# Patient Record
Sex: Female | Born: 1960 | Race: White | Hispanic: No | Marital: Married | Smoking: Former smoker
Health system: Southern US, Community
[De-identification: ages and names within clinical notes are randomized; demographics above are authoritative.]

## PROBLEM LIST (undated history)

## (undated) DIAGNOSIS — J45909 Unspecified asthma, uncomplicated: Secondary | ICD-10-CM

## (undated) HISTORY — PX: APPENDECTOMY: SHX54

## (undated) HISTORY — PX: CHOLECYSTECTOMY: SHX55

---

## 2005-12-28 ENCOUNTER — Emergency Department: Payer: Self-pay | Admitting: Unknown Physician Specialty

## 2008-01-12 ENCOUNTER — Emergency Department: Payer: Self-pay | Admitting: Emergency Medicine

## 2008-03-10 ENCOUNTER — Emergency Department: Payer: Self-pay | Admitting: Internal Medicine

## 2009-02-13 ENCOUNTER — Emergency Department: Payer: Self-pay | Admitting: Internal Medicine

## 2009-10-14 ENCOUNTER — Emergency Department: Payer: Self-pay | Admitting: Internal Medicine

## 2011-07-28 IMAGING — CR DG CHEST 1V PORT
1 series · 1 of 1 positions shown · non-contrast
Comparison: none

REASON FOR EXAM: sob
COMMENTS:

PROCEDURE:     DXR - DXR PORTABLE CHEST SINGLE VIEW  - February 13, 2009  [DATE]
RESULT:     The lungs are well expanded. There is no focal infiltrate.
Interstitial markings are mildly prominent. The cardiac silhouette is not
inter enlarged. The pulmonary vascularity is not engorged.

[view not recorded]
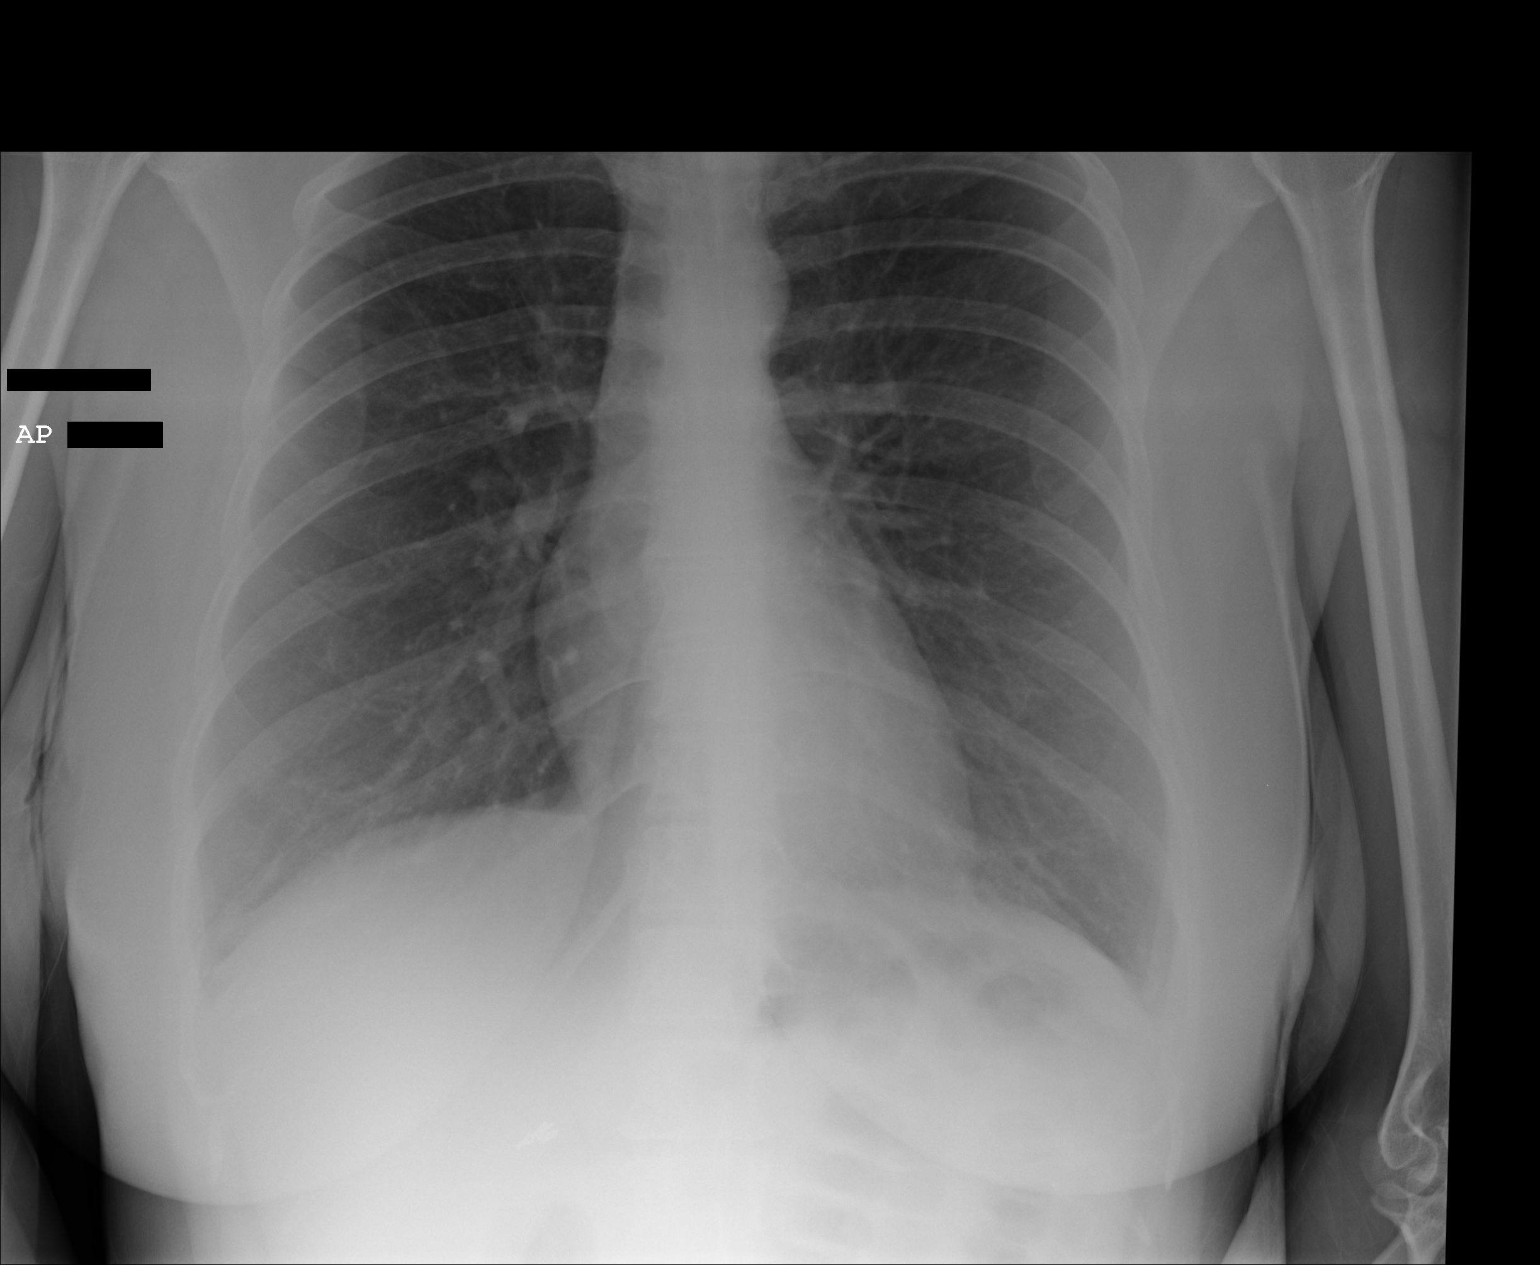

[1 of 1 positions shown; findings below may reference images not displayed]

IMPRESSION: There is mild hyperinflation which may be voluntary or
which could reflect reactive airway disease. I see no definite pneumonia.

## 2017-02-10 ENCOUNTER — Emergency Department
Admission: EM | Admit: 2017-02-10 | Discharge: 2017-02-10 | Disposition: A | Discharge status: Home / Self Care | Payer: Self-pay | Attending: Emergency Medicine | Admitting: Emergency Medicine

## 2017-02-10 ENCOUNTER — Other Ambulatory Visit: Payer: Self-pay

## 2017-02-10 ENCOUNTER — Encounter: Payer: Self-pay | Admitting: Emergency Medicine

## 2017-02-10 DIAGNOSIS — J45909 Unspecified asthma, uncomplicated: Secondary | ICD-10-CM | POA: Insufficient documentation

## 2017-02-10 DIAGNOSIS — R42 Dizziness and giddiness: Secondary | ICD-10-CM | POA: Insufficient documentation

## 2017-02-10 DIAGNOSIS — Z87891 Personal history of nicotine dependence: Secondary | ICD-10-CM | POA: Insufficient documentation

## 2017-02-10 HISTORY — DX: Unspecified asthma, uncomplicated: J45.909

## 2017-02-10 LAB — BASIC METABOLIC PANEL
Anion gap: 9 (ref 5–15)
BUN: 14 mg/dL (ref 6–20)
CHLORIDE: 101 mmol/L (ref 101–111)
CO2: 25 mmol/L (ref 22–32)
CREATININE: 0.91 mg/dL (ref 0.44–1.00)
Calcium: 9.1 mg/dL (ref 8.9–10.3)
GFR calc non Af Amer: >60 mL/min (ref >60)
Glucose, Bld: 93 mg/dL (ref 65–99)
POTASSIUM: 3.5 mmol/L (ref 3.5–5.1)
Sodium: 135 mmol/L (ref 135–145)

## 2017-02-10 LAB — URINALYSIS, COMPLETE (UACMP) WITH MICROSCOPIC
BILIRUBIN URINE: NEGATIVE
Glucose, UA: NEGATIVE mg/dL
Hgb urine dipstick: NEGATIVE
KETONES UR: 5 mg/dL — AB
LEUKOCYTES UA: NEGATIVE
Nitrite: NEGATIVE
PH: 6 (ref 5.0–8.0)
PROTEIN: NEGATIVE mg/dL
Specific Gravity, Urine: 1.014 (ref 1.005–1.030)

## 2017-02-10 LAB — CBC
HEMATOCRIT: 43.6 % (ref 35.0–47.0)
Hemoglobin: 15 g/dL (ref 12.0–16.0)
MCH: 31 pg (ref 26.0–34.0)
MCHC: 34.5 g/dL (ref 32.0–36.0)
MCV: 89.6 fL (ref 80.0–100.0)
PLATELETS: 307 10*3/uL (ref 150–440)
RBC: 4.86 MIL/uL (ref 3.80–5.20)
RDW: 14 % (ref 11.5–14.5)
WBC: 7 10*3/uL (ref 3.6–11.0)

## 2017-02-10 LAB — GLUCOSE, CAPILLARY: GLUCOSE-CAPILLARY: 94 mg/dL (ref 65–99)

## 2017-02-10 MED ORDER — DIAZEPAM 2 MG PO TABS: 2.0000 mg | ORAL_TABLET | Freq: Three times a day (TID) | ORAL | 0 refills | Status: AC | PRN

## 2017-02-10 MED ORDER — DIAZEPAM 2 MG PO TABS
2.0000 mg | ORAL_TABLET | Freq: Once | ORAL | Status: AC
  Administered 2017-02-10: 2 mg via ORAL
  Filled 2017-02-10: qty 1

## 2017-02-10 MED ORDER — MECLIZINE HCL 25 MG PO TABS
25.0000 mg | ORAL_TABLET | Freq: Once | ORAL | Status: AC
  Administered 2017-02-10: 25 mg via ORAL
  Filled 2017-02-10: qty 1

## 2017-02-10 NOTE — Discharge Instructions (Signed)
Please seek medical attention for any high fevers, chest pain, shortness of breath, change in behavior, persistent vomiting, bloody stool or any other new or concerning symptoms.  

## 2017-02-10 NOTE — ED Triage Notes (Signed)
Pt to ED via POV c/o dizziness, pt describes sensation of the room spinning. Pt states that dizziness is worse when she moves her head. Pt also states that she is nauseated. Pt noted to have redness on her upper chest. Pt denies any new medications or foods. Pt denies any changes in her environment. Pt in NAD at this time.

## 2017-02-10 NOTE — ED Provider Notes (Signed)
[facility] Emergency Department Provider Note   ____________________________________________   I have reviewed the triage vital signs and the nursing notes.   HISTORY  Chief Complaint Dizziness   History limited by: Not Limited   HPI Sue Russell is a 56 y.o. female who presents to the emergency department today because of concern for dizziness.  DURATION:2 days TIMING: intermittent SEVERITY: severe QUALITY: sense of movement CONTEXT: patient states that the symptoms started suddenly two days ago. Has never had anything like this before. Denies any recent illness. MODIFYING FACTORS: worse with movement. Goes away with rest. ASSOCIATED SYMPTOMS: denies any headache. Denies any ear pain.  Per medical record review patient has a history of asthma.  Past Medical History:  Diagnosis Date  . Asthma     There are no active problems to display for this patient.   Past Surgical History:  Procedure Laterality Date  . APPENDECTOMY    . CHOLECYSTECTOMY      Prior to Admission medications   Not on File    Allergies Patient has no known allergies.  No family history on file.  Social History Social History   Tobacco Use  . Smoking status: Former Games developermoker  . Smokeless tobacco: Never Used  Substance Use Topics  . Alcohol use: No    Frequency: Never  . Drug use: No    Review of Systems Constitutional: No fever/chills Eyes: No visual changes. ENT: No sore throat. Cardiovascular: Denies chest pain. Respiratory: Denies shortness of breath. Gastrointestinal: No abdominal pain.  No nausea, no vomiting.  No diarrhea.   Genitourinary: Negative for dysuria. Musculoskeletal: Negative for back pain. Skin: Negative for rash. Neurological: Positive for dizziness.  ____________________________________________   PHYSICAL EXAM:  VITAL SIGNS: ED Triage Vitals  Enc Vitals Group     BP 02/10/17 1645 (!) 200/103     Pulse Rate 02/10/17 1642  89     Resp 02/10/17 1642 16     Temp 02/10/17 1642 98.2 F (36.8 C)     Temp Source 02/10/17 1642 Oral     SpO2 02/10/17 1642 95 %   Constitutional: Alert and oriented. Well appearing and in no distress. Eyes: Conjunctivae are normal.  ENT   Head: Normocephalic and atraumatic. TM wnl bilaterally.   Nose: No congestion/rhinnorhea.   Mouth/Throat: Mucous membranes are moist.   Neck: No stridor. Hematological/Lymphatic/Immunilogical: No cervical lymphadenopathy. Cardiovascular: Normal rate, regular rhythm.  No murmurs, rubs, or gallops.  Respiratory: Normal respiratory effort without tachypnea nor retractions. Breath sounds are clear and equal bilaterally. No wheezes/rales/rhonchi. Gastrointestinal: Soft and non tender. No rebound. No guarding.  Genitourinary: Deferred Musculoskeletal: Normal range of motion in all extremities. No lower extremity edema. Neurologic:  Normal speech and language. Face symmetric. Strength 5/5 in upper and lower extremities. Sensation intact. Finger to nose normal. Skin:  Skin is warm, dry and intact. No rash noted. Psychiatric: Mood and affect are normal. Speech and behavior are normal. Patient exhibits appropriate insight and judgment.  ____________________________________________    LABS (pertinent positives/negatives)  BMP wnl CBC wnl Glu 94 UA not consistent with infection ____________________________________________   EKG  I, Phineas SemenGraydon Johnelle Tafolla, attending physician, personally viewed and interpreted this EKG  EKG Time: 1639 Rate: 91 Rhythm: normal sinus rhythm Axis: normal Intervals: qtc 440 QRS: narrow ST changes: no st elevation Impression: normal ekg   ____________________________________________    RADIOLOGY  None  ____________________________________________   PROCEDURES  Procedures  ____________________________________________   INITIAL IMPRESSION / ASSESSMENT AND PLAN / ED  COURSE  Pertinent labs &  imaging results that were available during my care of the patient were reviewed by me and considered in my medical decision making (see chart for details).  Patient presented to the emergency department today because of dizziness.  Differential would include vertigo, central lesion, anemia, electrolyte abnormality, infection.  Clinical history is very consistent with peripheral cause of vertigo.  It does go away when the patient is still.  It does get worse with movement of the head.  Patient stated that she did get some benefit from Valium.  Blood work and urine without concerning findings.  I do think at this point BPPV likely.  Discussed this with the patient.  I did discuss return precautions.  Will give patient ENT follow-up information.   ____________________________________________   FINAL CLINICAL IMPRESSION(S) / ED DIAGNOSES  Final diagnoses:  Vertigo     Note: This dictation was prepared with Dragon dictation. Any transcriptional errors that result from this process are unintentional     Phineas SemenGoodman, Angles Trevizo, MD 02/10/17 2100
